# Patient Record
Sex: Female | Born: 1980 | Race: White | Hispanic: No | Smoking: Never smoker
Health system: Southern US, Community
[De-identification: ages and names within clinical notes are randomized; demographics above are authoritative.]

## PROBLEM LIST (undated history)

## (undated) HISTORY — PX: CLEFT PALATE REPAIR: SUR1165

---

## 2012-08-03 ENCOUNTER — Emergency Department: Payer: Self-pay | Admitting: Emergency Medicine

## 2012-08-03 LAB — URINALYSIS, COMPLETE
Bilirubin,UR: NEGATIVE
Glucose,UR: NEGATIVE mg/dL (ref 0–75)
Ketone: NEGATIVE
Nitrite: NEGATIVE
Ph: 5 (ref 4.5–8.0)
Protein: NEGATIVE
RBC,UR: 11 /HPF (ref 0–5)
Specific Gravity: 1.028 (ref 1.003–1.030)
Squamous Epithelial: 9

## 2012-08-03 LAB — PREGNANCY, URINE: Pregnancy Test, Urine: NEGATIVE m[IU]/mL

## 2012-08-15 ENCOUNTER — Emergency Department: Payer: Self-pay | Admitting: Emergency Medicine

## 2012-08-15 LAB — CBC WITH DIFFERENTIAL/PLATELET
Basophil #: 0.1 10*3/uL (ref 0.0–0.1)
HCT: 37.8 % (ref 35.0–47.0)
Lymphocyte #: 1.9 10*3/uL (ref 1.0–3.6)
MCH: 27.1 pg (ref 26.0–34.0)
Platelet: 412 10*3/uL (ref 150–440)
WBC: 8 10*3/uL (ref 3.6–11.0)

## 2012-08-15 LAB — COMPREHENSIVE METABOLIC PANEL
Albumin: 4.2 g/dL (ref 3.4–5.0)
Alkaline Phosphatase: 89 U/L (ref 50–136)
Bilirubin,Total: 0.4 mg/dL (ref 0.2–1.0)
Calcium, Total: 9.3 mg/dL (ref 8.5–10.1)
Chloride: 105 mmol/L (ref 98–107)
Co2: 28 mmol/L (ref 21–32)
Creatinine: 0.7 mg/dL (ref 0.60–1.30)
EGFR (Non-African Amer.): >60
Glucose: 73 mg/dL (ref 65–99)
Osmolality: 274 (ref 275–301)
Potassium: 3.5 mmol/L (ref 3.5–5.1)
SGOT(AST): 20 U/L (ref 15–37)
SGPT (ALT): 31 U/L (ref 12–78)
Total Protein: 8.3 g/dL — ABNORMAL HIGH (ref 6.4–8.2)

## 2012-08-15 LAB — URINALYSIS, COMPLETE
Bilirubin,UR: NEGATIVE
Blood: NEGATIVE
Ketone: NEGATIVE
Nitrite: NEGATIVE
Protein: NEGATIVE
RBC,UR: 2 /HPF (ref 0–5)
Squamous Epithelial: 3
WBC UR: 3 /HPF (ref 0–5)

## 2012-08-15 LAB — PREGNANCY, URINE: Pregnancy Test, Urine: NEGATIVE m[IU]/mL

## 2012-08-15 LAB — LIPASE, BLOOD: Lipase: 130 U/L (ref 73–393)

## 2015-09-09 ENCOUNTER — Encounter (HOSPITAL_COMMUNITY): Payer: Self-pay | Admitting: *Deleted

## 2015-09-09 ENCOUNTER — Emergency Department (HOSPITAL_COMMUNITY): Payer: No Typology Code available for payment source

## 2015-09-09 ENCOUNTER — Emergency Department (HOSPITAL_COMMUNITY)
Admission: EM | Admit: 2015-09-09 | Discharge: 2015-09-10 | Disposition: A | Discharge status: Home / Self Care | Payer: No Typology Code available for payment source | Attending: Emergency Medicine | Admitting: Emergency Medicine

## 2015-09-09 DIAGNOSIS — Z91040 Latex allergy status: Secondary | ICD-10-CM | POA: Insufficient documentation

## 2015-09-09 DIAGNOSIS — R079 Chest pain, unspecified: Secondary | ICD-10-CM | POA: Diagnosis present

## 2015-09-09 DIAGNOSIS — E669 Obesity, unspecified: Secondary | ICD-10-CM | POA: Diagnosis not present

## 2015-09-09 DIAGNOSIS — Z880 Allergy status to penicillin: Secondary | ICD-10-CM | POA: Insufficient documentation

## 2015-09-09 LAB — CBC
HCT: 36.7 % (ref 36.0–46.0)
Hemoglobin: 11.3 g/dL — ABNORMAL LOW (ref 12.0–15.0)
MCH: 25.7 pg — ABNORMAL LOW (ref 26.0–34.0)
MCHC: 30.8 g/dL (ref 30.0–36.0)
MCV: 83.4 fL (ref 78.0–100.0)
Platelets: 419 10*3/uL — ABNORMAL HIGH (ref 150–400)
RBC: 4.4 MIL/uL (ref 3.87–5.11)
RDW: 14.4 % (ref 11.5–15.5)
WBC: 10.4 10*3/uL (ref 4.0–10.5)

## 2015-09-09 LAB — BASIC METABOLIC PANEL
Anion gap: 9 (ref 5–15)
BUN: 9 mg/dL (ref 6–20)
CALCIUM: 8.5 mg/dL — AB (ref 8.9–10.3)
CO2: 23 mmol/L (ref 22–32)
Chloride: 109 mmol/L (ref 101–111)
Creatinine, Ser: 0.63 mg/dL (ref 0.44–1.00)
GFR calc Af Amer: >60 mL/min (ref >60)
GLUCOSE: 109 mg/dL — AB (ref 65–99)
Potassium: 3.8 mmol/L (ref 3.5–5.1)
Sodium: 141 mmol/L (ref 135–145)

## 2015-09-09 NOTE — ED Notes (Signed)
Pt states that she woke up around 21:30 tonight with substernal chest pain; pt states that this has happened before; pt denies any other symptoms; pt states that it feels like someone is jumping up and down on her chest

## 2015-09-10 LAB — I-STAT TROPONIN, ED
TROPONIN I, POC: 0.01 ng/mL (ref 0.00–0.08)
Troponin i, poc: 0 ng/mL (ref 0.00–0.08)

## 2015-09-10 MED ORDER — PANTOPRAZOLE SODIUM 20 MG PO TBEC: 20.0000 mg | DELAYED_RELEASE_TABLET | Freq: Every day | ORAL | Status: DC

## 2015-09-10 MED ORDER — SUCRALFATE 1 G PO TABS
1.0000 g | ORAL_TABLET | Freq: Once | ORAL | Status: AC
  Administered 2015-09-10: 1 g via ORAL
  Filled 2015-09-10: qty 1

## 2015-09-10 MED ORDER — PANTOPRAZOLE SODIUM 40 MG PO TBEC
40.0000 mg | DELAYED_RELEASE_TABLET | Freq: Once | ORAL | Status: AC
  Administered 2015-09-10: 40 mg via ORAL
  Filled 2015-09-10: qty 1

## 2015-09-10 NOTE — Discharge Instructions (Signed)
Nonspecific Chest Pain  °Chest pain can be caused by many different conditions. There is always a chance that your pain could be related to something serious, such as a heart attack or a blood clot in your lungs. Chest pain can also be caused by conditions that are not life-threatening. If you have chest pain, it is very important to follow up with your health care provider. °CAUSES  °Chest pain can be caused by: °· Heartburn. °· Pneumonia or bronchitis. °· Anxiety or stress. °· Inflammation around your heart (pericarditis) or lung (pleuritis or pleurisy). °· A blood clot in your lung. °· A collapsed lung (pneumothorax). It can develop suddenly on its own (spontaneous pneumothorax) or from trauma to the chest. °· Shingles infection (varicella-zoster virus). °· Heart attack. °· Damage to the bones, muscles, and cartilage that make up your chest wall. This can include: °¨ Bruised bones due to injury. °¨ Strained muscles or cartilage due to frequent or repeated coughing or overwork. °¨ Fracture to one or more ribs. °¨ Sore cartilage due to inflammation (costochondritis). °RISK FACTORS  °Risk factors for chest pain may include: °· Activities that increase your risk for trauma or injury to your chest. °· Respiratory infections or conditions that cause frequent coughing. °· Medical conditions or overeating that can cause heartburn. °· Heart disease or family history of heart disease. °· Conditions or health behaviors that increase your risk of developing a blood clot. °· Having had chicken pox (varicella zoster). °SIGNS AND SYMPTOMS °Chest pain can feel like: °· Burning or tingling on the surface of your chest or deep in your chest. °· Crushing, pressure, aching, or squeezing pain. °· Dull or sharp pain that is worse when you move, cough, or take a deep breath. °· Pain that is also felt in your back, neck, shoulder, or arm, or pain that spreads to any of these areas. °Your chest pain may come and go, or it may stay  constant. °DIAGNOSIS °Lab tests or other studies may be needed to find the cause of your pain. Your health care provider may have you take a test called an ambulatory ECG (electrocardiogram). An ECG records your heartbeat patterns at the time the test is performed. You may also have other tests, such as: °· Transthoracic echocardiogram (TTE). During echocardiography, sound waves are used to create a picture of all of the heart structures and to look at how blood flows through your heart. °· Transesophageal echocardiogram (TEE). This is a more advanced imaging test that obtains images from inside your body. It allows your health care provider to see your heart in finer detail. °· Cardiac monitoring. This allows your health care provider to monitor your heart rate and rhythm in real time. °· Holter monitor. This is a portable device that records your heartbeat and can help to diagnose abnormal heartbeats. It allows your health care provider to track your heart activity for several days, if needed. °· Stress tests. These can be done through exercise or by taking medicine that makes your heart beat more quickly. °· Blood tests. °· Imaging tests. °TREATMENT  °Your treatment depends on what is causing your chest pain. Treatment may include: °· Medicines. These may include: °¨ Acid blockers for heartburn. °¨ Anti-inflammatory medicine. °¨ Pain medicine for inflammatory conditions. °¨ Antibiotic medicine, if an infection is present. °¨ Medicines to dissolve blood clots. °¨ Medicines to treat coronary artery disease. °· Supportive care for conditions that do not require medicines. This may include: °¨ Resting. °¨ Applying heat   or cold packs to injured areas. °¨ Limiting activities until pain decreases. °HOME CARE INSTRUCTIONS °· If you were prescribed an antibiotic medicine, finish it all even if you start to feel better. °· Avoid any activities that bring on chest pain. °· Do not use any tobacco products, including  cigarettes, chewing tobacco, or electronic cigarettes. If you need help quitting, ask your health care provider. °· Do not drink alcohol. °· Take medicines only as directed by your health care provider. °· Keep all follow-up visits as directed by your health care provider. This is important. This includes any further testing if your chest pain does not go away. °· If heartburn is the cause for your chest pain, you may be told to keep your head raised (elevated) while sleeping. This reduces the chance that acid will go from your stomach into your esophagus. °· Make lifestyle changes as directed by your health care provider. These may include: °¨ Getting regular exercise. Ask your health care provider to suggest some activities that are safe for you. °¨ Eating a heart-healthy diet. A registered dietitian can help you to learn healthy eating options. °¨ Maintaining a healthy weight. °¨ Managing diabetes, if necessary. °¨ Reducing stress. °SEEK MEDICAL CARE IF: °· Your chest pain does not go away after treatment. °· You have a rash with blisters on your chest. °· You have a fever. °SEEK IMMEDIATE MEDICAL CARE IF:  °· Your chest pain is worse. °· You have an increasing cough, or you cough up blood. °· You have severe abdominal pain. °· You have severe weakness. °· You faint. °· You have chills. °· You have sudden, unexplained chest discomfort. °· You have sudden, unexplained discomfort in your arms, back, neck, or jaw. °· You have shortness of breath at any time. °· You suddenly start to sweat, or your skin gets clammy. °· You feel nauseous or you vomit. °· You suddenly feel light-headed or dizzy. °· Your heart begins to beat quickly, or it feels like it is skipping beats. °These symptoms may represent a serious problem that is an emergency. Do not wait to see if the symptoms will go away. Get medical help right away. Call your local emergency services (911 in the [place] Do not drive yourself to the hospital. °  °This  information is not intended to replace advice given to you by your health care provider. Make sure you discuss any questions you have with your health care provider. °  °Document Released: 03/31/2005 Document Revised: 07/12/2014 Document Reviewed: 01/25/2014 °Elsevier Interactive Patient Education ©2016 [employer] ° °

## 2015-09-10 NOTE — ED Notes (Signed)
PA at bedside.

## 2015-09-10 NOTE — ED Provider Notes (Signed)
CSN: [id number]     Arrival date & time 09/09/15  2216 History   First MD Initiated Contact with Patient 09/10/15 [phone]     Chief Complaint  Patient presents with  . Chest Pain     (Consider location/radiation/quality/duration/timing/severity/associated sxs/prior Treatment) HPI Comments: 35 year old female with no significant past medical history presents to the emergency department for evaluation of chest pain. Patient has felt pain from her upper chest to her lower chest. She denies any radiation to her back or shoulders. Pain has been constant since onset. It began shortly after waking from sleep at 2130. Patient had a similar episode of symptoms 1 week ago and was evaluated at [facility] with a negative cardiac workup. Patient denies taking any medications for her symptoms today. She describes the pain as "someone jumping up and down a lot on my chest". Patient denies syncope, diaphoresis, nausea, vomiting, or extremity numbness/weakness. She has had no leg swelling and denies a personal history of DVT/PE. She is not currently on birth control and denies recent surgeries or hospitalization. No prolonged travel. Patient states that she eats regular meals. She does not report a history of reflux. She has been sleeping less and more erratically lately as she works 3 jobs. FHx of ACS in grandparents in their 62's. No FHx of sudden cardiac death.  Patient is a 35 y.o. female presenting with chest pain. The history is provided by the patient. No language interpreter was used.  Chest Pain Associated symptoms: no fever, no nausea and not vomiting     History reviewed. No pertinent past medical history. Past Surgical History  Procedure Laterality Date  . Cleft palate repair     No family history on file. Social History  Substance Use Topics  . Smoking status: Never Smoker   . Smokeless tobacco: None  . Alcohol Use: No   OB History    No data available      Review of Systems  Constitutional:  Negative for fever.  Cardiovascular: Positive for chest pain.  Gastrointestinal: Negative for nausea and vomiting.  Neurological: Negative for syncope.  All other systems reviewed and are negative.   Allergies  Benadryl; Latex; and Penicillins  Home Medications   Prior to Admission medications   Medication Sig Start Date End Date Taking? Authorizing Provider  pantoprazole (PROTONIX) 20 MG tablet Take 1 tablet (20 mg total) by mouth daily. 09/10/15   Antony Madura, PA-C   BP 123/63 mmHg  Pulse 77  Temp(Src) 98.1 F (36.7 C) (Oral)  Resp 21  Ht  (1.651 m)  Wt 117.935 kg  BMI 43.27 kg/m2  SpO2 100%  LMP 09/08/2015   Physical Exam  Constitutional: She is oriented to person, place, and time. She appears well-developed and well-nourished. No distress.  Obese female in NAD  HENT:  Head: Normocephalic and atraumatic.  Eyes: Conjunctivae and EOM are normal. No scleral icterus.  Neck: Normal range of motion.  No JVD  Cardiovascular: Normal rate, regular rhythm and intact distal pulses.   Pulmonary/Chest: Effort normal. No respiratory distress. She has no wheezes. She has no rales.  Respirations even and unlabored  Musculoskeletal: Normal range of motion.  Neurological: She is alert and oriented to person, place, and time. She exhibits normal muscle tone. Coordination normal.  Skin: Skin is warm and dry. No rash noted. She is not diaphoretic. No erythema. No pallor.  Psychiatric: She has a normal mood and affect. Her behavior is normal.  Nursing note and vitals reviewed.  ED Course  Procedures (including critical care time) Labs Review Labs Reviewed  BASIC METABOLIC PANEL - Abnormal; Notable for the following:    Glucose, Bld 109 (*)    Calcium 8.5 (*)    All other components within normal limits  CBC - Abnormal; Notable for the following:    Hemoglobin 11.3 (*)    MCH 25.7 (*)    Platelets 419 (*)    All other components within normal limits  I-STAT TROPOININ, ED   Rosezena SensorI-STAT TROPOININ, ED    Imaging Review Dg Chest 2 View  09/09/2015  CLINICAL DATA:  Chest pain EXAM: CHEST  2 VIEW COMPARISON:  None. FINDINGS: The heart size and mediastinal contours are within normal limits. Both lungs are clear. The visualized skeletal structures are unremarkable. IMPRESSION: No active cardiopulmonary disease. Electronically Signed   By: Marlan Palauharles  Clark M.D.   On: 09/09/2015 22:55   I have personally reviewed and evaluated these images and lab results as part of my medical decision-making.   EKG Interpretation   Date/Time:  Tuesday September 09 2015 22:34:17 EST Ventricular Rate:  86 PR Interval:  156 QRS Duration: 92 QT Interval:  364 QTC Calculation: 435 R Axis:   73 Text Interpretation:  Sinus rhythm Normal ECG Confirmed by POLLINA  MD,  CHRISTOPHER [phone]([id number]) on 09/10/2015 4:53:52 AM      MDM   Final diagnoses:  Chest pain, unspecified chest pain type    35 year old female presents to the emergency department for evaluation of constant chest pain which began at 2130. She has a negative troponin 2 and a nonischemic EKG. Chest x-ray negative for acute cardiopulmonary findings. Doubt aortic dissection. Also doubt pulmonary embolus. Patient is PERC negative. Heart score is 1 for obesity c/w low risk of MACE. Will d/c with cardiology referral for f/u and outpatient stress test. Will start on Protonix for coverage of reflux. Return precautions discussed and provided. Patient discharged in satisfactory condition with no unaddressed concerns.   Filed Vitals:   09/09/15 2229 09/10/15 0354  BP: 145/72 123/63  Pulse: 87 77  Temp: 98.1 F (36.7 C)   TempSrc: Oral   Resp: 20 21  Height: 5\' 5"  (1.651 m)   Weight: 117.935 kg   SpO2:  100%     Antony MaduraKelly Brode Sculley, PA-C 09/10/15 0459  Gilda Creasehristopher J Pollina, MD 09/10/15 [phone]

## 2015-11-23 ENCOUNTER — Encounter (HOSPITAL_COMMUNITY): Payer: Self-pay | Admitting: Emergency Medicine

## 2015-11-23 ENCOUNTER — Ambulatory Visit (HOSPITAL_COMMUNITY)
Admission: EM | Admit: 2015-11-23 | Discharge: 2015-11-23 | Disposition: A | Discharge status: Home / Self Care | Payer: PRIVATE HEALTH INSURANCE | Attending: Family Medicine | Admitting: Family Medicine

## 2015-11-23 DIAGNOSIS — J069 Acute upper respiratory infection, unspecified: Secondary | ICD-10-CM | POA: Diagnosis not present

## 2015-11-23 DIAGNOSIS — J309 Allergic rhinitis, unspecified: Secondary | ICD-10-CM | POA: Diagnosis not present

## 2015-11-23 MED ORDER — HYDROCOD POLST-CPM POLST ER 10-8 MG/5ML PO SUER: 5.0000 mL | Freq: Two times a day (BID) | ORAL | Status: DC | PRN

## 2015-11-23 NOTE — ED Provider Notes (Signed)
CSN: [id number]     Arrival date & time 11/23/15  1756 History   First MD Initiated Contact with Patient 11/23/15 1912     No chief complaint on file.  (Consider location/radiation/quality/duration/timing/severity/associated sxs/prior Treatment) Patient is a 35 y.o. female presenting with cough. The history is provided by the patient. No language interpreter was used.  Cough Cough characteristics:  Non-productive (Cough for 3 days) Severity:  Moderate Onset quality:  Gradual Timing:  Constant Progression:  Unchanged Chronicity:  Recurrent Smoker: no   Context: animal exposure   Context: not exposure to allergens, not sick contacts, not smoke exposure, not weather changes and not with activity   Worsened by:  Environmental changes (Worse with heat. She keeps her fan on at home) Ineffective treatments:  None tried Associated symptoms: chest pain and sinus congestion   Associated symptoms: no chills, no ear pain, no fever, no headaches, no rhinorrhea, no shortness of breath and no sore throat   Associated symptoms comment:  Chest pain with cough   No past medical history on file. Past Surgical History  Procedure Laterality Date  . Cleft palate repair     No family history on file. Social History  Substance Use Topics  . Smoking status: Never Smoker   . Smokeless tobacco: Not on file  . Alcohol Use: No   OB History    No data available     Review of Systems  Constitutional: Negative for fever and chills.  HENT: Negative for ear pain, rhinorrhea, sinus pressure, sneezing and sore throat.   Respiratory: Positive for cough. Negative for shortness of breath.   Cardiovascular: Positive for chest pain.  Gastrointestinal: Negative.   Neurological: Negative for headaches.  All other systems reviewed and are negative.   Allergies  Benadryl; Latex; and Penicillins  Home Medications   Prior to Admission medications   Medication Sig Start Date End Date Taking? Authorizing  Provider  pantoprazole (PROTONIX) 20 MG tablet Take 1 tablet (20 mg total) by mouth daily. 09/10/15   Antony Madura, PA-C   Meds Ordered and Administered this Visit  Medications - No data to display  Pulse 93  Temp(Src) 98.8 F (37.1 C) (Oral)  Resp 20  SpO2 100% No data found.   Physical Exam  Constitutional: She appears well-developed. No distress.  HENT:  Head: Normocephalic.  Right Ear: Tympanic membrane, external ear and ear canal normal. No drainage. Tympanic membrane is not erythematous and not bulging.  Left Ear: Tympanic membrane, external ear and ear canal normal. No drainage. Tympanic membrane is not erythematous and not bulging.  Nose: Right sinus exhibits no maxillary sinus tenderness and no frontal sinus tenderness. Left sinus exhibits no maxillary sinus tenderness and no frontal sinus tenderness.  Mouth/Throat: Uvula is midline, oropharynx is clear and moist and mucous membranes are normal.    Cardiovascular: Normal rate, regular rhythm and normal heart sounds.   No murmur heard. Pulmonary/Chest: Effort normal and breath sounds normal. No respiratory distress. She has no wheezes. She exhibits no tenderness.  Nursing note and vitals reviewed.   ED Course  Procedures (including critical care time)  Labs Review Labs Reviewed - No data to display  Imaging Review No results found.   Visual Acuity Review  Right Eye Distance:   Left Eye Distance:   Bilateral Distance:    Right Eye Near:   Left Eye Near:    Bilateral Near:         MDM  No diagnosis found. Upper respiratory infection  Allergic rhinitis, unspecified allergic rhinitis type  Likely viral in origin. Patient looks completely stable. Pulmonary exam benign'O2 Sat 100% on RA. Tussionex prescribed prn cough and nasal congestion. No A/B indicated at this time. Patient reassured. F/U as needed.    Doreene ElandKehinde T Hurman Ketelsen, MD 11/23/15 [phone]

## 2015-11-23 NOTE — ED Notes (Signed)
PT reports a cough that started Friday. Nonproductive. Runny nose. PT reports she is beginning to have facial pressure. PT reports she gets bronchitis and sinus infections a few times per year.

## 2015-11-23 NOTE — Discharge Instructions (Signed)

## 2015-11-30 ENCOUNTER — Ambulatory Visit (HOSPITAL_COMMUNITY)
Admission: EM | Admit: 2015-11-30 | Discharge: 2015-11-30 | Disposition: A | Discharge status: Home / Self Care | Payer: PRIVATE HEALTH INSURANCE | Attending: Emergency Medicine | Admitting: Emergency Medicine

## 2015-11-30 ENCOUNTER — Encounter (HOSPITAL_COMMUNITY): Payer: Self-pay | Admitting: *Deleted

## 2015-11-30 DIAGNOSIS — J018 Other acute sinusitis: Secondary | ICD-10-CM

## 2015-11-30 MED ORDER — AZITHROMYCIN 250 MG PO TABS: ORAL_TABLET | ORAL | Status: AC

## 2015-11-30 MED ORDER — PREDNISONE 50 MG PO TABS
ORAL_TABLET | ORAL | Status: AC
Start: 2015-11-30 — End: ?

## 2015-11-30 MED ORDER — HYDROCOD POLST-CPM POLST ER 10-8 MG/5ML PO SUER: 5.0000 mL | Freq: Two times a day (BID) | ORAL | Status: AC | PRN

## 2015-11-30 NOTE — Discharge Instructions (Signed)
You have a sinus infection. Take azithromycin and prednisone as prescribed. Use the Tussionex as needed for cough. While you're at work you can mix honey, water, and lemon juice. This is a natural cough suppressant. You should start to feel better in the next 2 days. Follow-up as needed.

## 2015-11-30 NOTE — ED Notes (Signed)
[facility] - given Rx for Tussionex w/ Codeine.  Pt has been taking, but is getting worse.  C/O thick, greenish brown nasal discharge, facial pain, with continued cough.  Unsure if fevers.

## 2015-11-30 NOTE — ED Provider Notes (Signed)
CSN: [id number]     Arrival date & time 11/30/15  1255 History   First MD Initiated Contact with Patient 11/30/15 1306     Chief Complaint  Patient presents with  . Facial Pain  . Nasal Congestion  . Cough   (Consider location/radiation/quality/duration/timing/severity/associated sxs/prior Treatment) HPI  She is a 35 year old woman here for evaluation of cough and sinus pressure. She states her symptoms started 10 days ago. She was seen here one week ago and diagnosed with likely viral upper respiratory infection. She was given a prescription for Tussionex. She states the Tussionex has helped with the cough. However, she reports worsening sinus pressure and congestion. She is getting blood tinged green sputum out of her nose. She also reports copious postnasal drainage. She has intermittent right ear pain. No known fevers. No nausea or vomiting. The cough is better overall, but still quite bothersome and she works.  History reviewed. No pertinent past medical history. Past Surgical History  Procedure Laterality Date  . Cleft palate repair     No family history on file. Social History  Substance Use Topics  . Smoking status: Never Smoker   . Smokeless tobacco: None  . Alcohol Use: Yes     Comment: occasional    OB History    No data available     Review of Systems As in history of present illness Allergies  Penicillins; Benadryl; and Latex  Home Medications   Prior to Admission medications   Medication Sig Start Date End Date Taking? Authorizing Provider  MedroxyPROGESTERone Acetate (DEPO-PROVERA IM) Inject into the muscle.   Yes Historical Provider, MD  azithromycin (ZITHROMAX Z-PAK) 250 MG tablet Take 2 pills today, then 1 pill daily until gone. 11/30/15   Charm RingsErin J Donyel Castagnola, MD  chlorpheniramine-HYDROcodone (TUSSIONEX PENNKINETIC ER) 10-8 MG/5ML SUER Take 5 mLs by mouth every 12 (twelve) hours as needed for cough. 11/30/15   Charm RingsErin J Jaylanie Boschee, MD  predniSONE (DELTASONE) 50 MG tablet  Take 1 pill daily for 5 days. 11/30/15   Charm RingsErin J Amarii Bordas, MD   Meds Ordered and Administered this Visit  Medications - No data to display  BP 149/89 mmHg  Pulse 101  Temp(Src) 98.3 F (36.8 C) (Oral)  Resp 18  SpO2 99%  LMP  (LMP Unknown) No data found.   Physical Exam  Constitutional: She is oriented to person, place, and time. She appears well-developed and well-nourished. No distress.  HENT:  Mouth/Throat: No oropharyngeal exudate.  Copious thick postnasal drainage. Mild oropharyngeal erythema. Nasal discharge present with minimal erythema. TMs normal bilaterally.  Neck: Neck supple.  Cardiovascular: Normal rate, regular rhythm and normal heart sounds.   No murmur heard. Pulmonary/Chest: Effort normal and breath sounds normal. No respiratory distress. She has no wheezes. She has no rales.  Lymphadenopathy:    She has no cervical adenopathy.  Neurological: She is alert and oriented to person, place, and time.    ED Course  Procedures (including critical care time)  Labs Review Labs Reviewed - No data to display  Imaging Review No results found.   MDM   1. Other acute sinusitis    We'll treat with a Z-Pak and prednisone. I did provide a refill of the Tussionex. Follow-up as needed.    Charm RingsErin J Maribeth Jiles, MD 11/30/15 [phone]

## 2015-12-21 ENCOUNTER — Encounter (HOSPITAL_COMMUNITY): Payer: Self-pay

## 2015-12-21 DIAGNOSIS — N61 Mastitis without abscess: Secondary | ICD-10-CM | POA: Diagnosis not present

## 2015-12-21 DIAGNOSIS — Z91040 Latex allergy status: Secondary | ICD-10-CM | POA: Insufficient documentation

## 2015-12-21 DIAGNOSIS — L03311 Cellulitis of abdominal wall: Secondary | ICD-10-CM | POA: Insufficient documentation

## 2015-12-21 NOTE — ED Notes (Signed)
Pt complaining of redness and swelling to R side. Pt states had zit Thursday, woresning today. Pt with large red area to R thoracic. Pt denies any drainage or puss. 9/10 pain.

## 2015-12-22 ENCOUNTER — Emergency Department (HOSPITAL_COMMUNITY)
Admission: EM | Admit: 2015-12-22 | Discharge: 2015-12-22 | Disposition: A | Discharge status: Home / Self Care | Payer: No Typology Code available for payment source | Attending: Emergency Medicine | Admitting: Emergency Medicine

## 2015-12-22 DIAGNOSIS — L03311 Cellulitis of abdominal wall: Secondary | ICD-10-CM

## 2015-12-22 DIAGNOSIS — N61 Mastitis without abscess: Secondary | ICD-10-CM

## 2015-12-22 MED ORDER — SULFAMETHOXAZOLE-TRIMETHOPRIM 800-160 MG PO TABS
1.0000 | ORAL_TABLET | Freq: Once | ORAL | Status: AC
  Administered 2015-12-22: 1 via ORAL
  Filled 2015-12-22: qty 1

## 2015-12-22 MED ORDER — SULFAMETHOXAZOLE-TRIMETHOPRIM 800-160 MG PO TABS: 1.0000 | ORAL_TABLET | Freq: Two times a day (BID) | ORAL | Status: DC

## 2015-12-22 NOTE — ED Provider Notes (Signed)
CSN: [id number]     Arrival date & time 12/21/15  2253 History  By signing my name below, I, [facility]tr[facility]arrissa Washington, attest that this documentation has been prepared under the direction and in the presence of Derwood KaplanAnkit Ildefonso Keaney, MD. Electronically Signed: Randell PatientMarrissa Washington, ED Scribe. 12/22/2015. 2:21 AM.   Chief Complaint  Patient presents with  . Abscess    The history is provided by the patient. No language interpreter was used.   HPI Comments: Meghan Schroeder is a 35 y.o. female who presents to the Emergency Department complaining of constant, gradually increasing in redness and swelling, mildly painful abscess to her right upper abdomen that first appeared 4 days ago. Pt states that she had a pimple appear on her right side  that completely resolved 3 days ago followed by diffuse erythema to the affected area that extends to the bottom of her right breast for the past 2 days Denies recent antibiotic usage. Denies any other symptoms currently.   History reviewed. No pertinent past medical history. Past Surgical History  Procedure Laterality Date  . Cleft palate repair     History reviewed. No pertinent family history. Social History  Substance Use Topics  . Smoking status: Never Smoker   . Smokeless tobacco: None  . Alcohol Use: Yes     Comment: occasional    OB History    No data available     Review of Systems A complete 10 system review of systems was obtained and all systems are negative except as noted in the HPI and PMH.    Allergies  Penicillins; Benadryl; and Latex  Home Medications   Prior to Admission medications   Medication Sig Start Date End Date Taking? Authorizing Provider  medroxyPROGESTERone (DEPO-PROVERA) 150 MG/ML injection Inject 1 mL into the muscle every 3 (three) months.  10/13/15 10/12/16 Yes Historical Provider, MD  azithromycin (ZITHROMAX Z-PAK) 250 MG tablet Take 2 pills today, then 1 pill daily until gone. Patient not taking: Reported on 12/22/2015  11/30/15   Charm RingsErin J Honig, MD  chlorpheniramine-HYDROcodone [facility](TUSSIONEX PENNKINETIC ER) 10-8 MG/5ML SUER Take 5 mLs by mouth every 12 (twelve) hours as needed for cough. Patient not taking: Reported on 12/22/2015 11/30/15   Charm RingsErin J Honig, MD  predniSONE (DELTASONE) 50 MG tablet Take 1 pill daily for 5 days. Patient not taking: Reported on 12/22/2015 11/30/15   Charm RingsErin J Honig, MD  sulfamethoxazole-trimethoprim (BACTRIM DS,SEPTRA DS) 800-160 MG tablet Take 1 tablet by mouth 2 (two) times daily. 12/22/15 12/29/15  Mayda Shippee, MD   BP 131/66 mmHg  Pulse 97  Temp(Src) 98.5 F (36.9 C) (Oral)  Resp 18  SpO2 100%  LMP  (LMP Unknown) Physical Exam  Constitutional: She is oriented to person, place, and time. She appears well-developed and well-nourished. No distress.  HENT:  Head: Normocephalic and atraumatic.  Eyes: Conjunctivae are normal.  Neck: Normal range of motion.  Cardiovascular: Normal rate.   Pulmonary/Chest: Effort normal. No respiratory distress.  Musculoskeletal: Normal range of motion.  Neurological: She is alert and oriented to person, place, and time.  Skin: Skin is warm and dry. Lesion noted. There is erythema.  On the right upper abdomen there is a 6 x12 cm erythematous lesion with induration in the middle and TTP. Pt also has significant erythema on her breast tissue with induration about 8 cm x 3 cm induration at the base.  Psychiatric: She has a normal mood and affect. Her behavior is normal.  Nursing note and vitals reviewed.  ED Course  Procedures   EMERGENCY DEPARTMENT US SOFT TISSUE INTERPRETATION "Study: Limited Soft Tissue Ultrasound"  INDICATIONS: Soft tissue infection Multiple views of the body part were obtained in real-time with a multi-frequency linear probe PERFORMED BY:  Myself IMAGES ARCHIVED?: Yes SIDE:Right  BODY PART:Chest Wall and Abdominal wall FINDINGS: No abcess noted INTERPRETATION:  Cellulitis present     DIAGNOSTIC STUDIES: Oxygen  Saturation is 99% on RA, normal by my interpretation.    COORDINATION OF CARE: 1:47 AM Will [place] abscess. Will prescribe antibiotics. Discussed treatment plan with pt at bedside and pt agreed to plan.  2:21 AM  US shows no abscess. Will prescribe antibiotics.   MDM   Final diagnoses:  Cellulitis of abdominal wall  Cellulitis of breast    I personally performed the services described in this documentation, which was scribed in my presence. The recorded information has been reviewed and is accurate.  Pt comes in with cc of rash. She has large erythematous area over her breast and RUQ - likely due to bacterial invasion from skin invasion due to her bra. She is immunocompetent and has no SIRS. US soft tissue reveals no abscess - so we will start bactrim and advise f/u with her PCP in 3 days. Strict ER return precautions have been discussed, and patient is agreeing with the plan and is comfortable with the workup done and the recommendations from the ER.    Derwood Kaplan, MD 12/22/15 [phone]

## 2015-12-22 NOTE — ED Notes (Signed)
Pt departed in NAD.  

## 2015-12-22 NOTE — Discharge Instructions (Signed)

## 2015-12-23 ENCOUNTER — Ambulatory Visit (HOSPITAL_COMMUNITY)
Admission: EM | Admit: 2015-12-23 | Discharge: 2015-12-23 | Disposition: A | Discharge status: Home / Self Care | Payer: PRIVATE HEALTH INSURANCE | Attending: Emergency Medicine | Admitting: Emergency Medicine

## 2015-12-23 ENCOUNTER — Encounter (HOSPITAL_COMMUNITY): Payer: Self-pay | Admitting: Emergency Medicine

## 2015-12-23 DIAGNOSIS — L03311 Cellulitis of abdominal wall: Secondary | ICD-10-CM | POA: Diagnosis not present

## 2015-12-23 DIAGNOSIS — N61 Mastitis without abscess: Secondary | ICD-10-CM

## 2015-12-23 MED ORDER — CLINDAMYCIN HCL 300 MG PO CAPS: 300.0000 mg | ORAL_CAPSULE | Freq: Four times a day (QID) | ORAL | Status: DC

## 2015-12-23 MED ORDER — NAPROXEN 500 MG PO TABS: 500.0000 mg | ORAL_TABLET | Freq: Two times a day (BID) | ORAL | Status: AC

## 2015-12-23 MED ORDER — HYDROCODONE-ACETAMINOPHEN 5-325 MG PO TABS: 1.0000 | ORAL_TABLET | Freq: Four times a day (QID) | ORAL | Status: AC | PRN

## 2015-12-23 NOTE — Discharge Instructions (Signed)
Please stop taking the Bactrim antibiotic as you are being prescribed a new antibiotic with more coverage.  Please be sure to eat a small snack or take probiotics (or eat yogurt with probiotics) to help prevent stomach upset.    Norco/Vicodin (hydrocodone-acetaminophen) is a narcotic pain medication, do not combine these medications with others containing tylenol. While taking, do not drink alcohol, drive, or perform any other activities that requires focus while taking these medications.   Please take antibiotics as prescribed and be sure to complete entire course even if you start to feel better to ensure infection does not come back.   Cellulitis Cellulitis is an infection of the skin and the tissue beneath it. The infected area is usually red and tender. Cellulitis occurs most often in the arms and lower legs.  CAUSES  Cellulitis is caused by bacteria that enter the skin through cracks or cuts in the skin. The most common types of bacteria that cause cellulitis are staphylococci and streptococci. SIGNS AND SYMPTOMS   Redness and warmth.  Swelling.  Tenderness or pain.  Fever. DIAGNOSIS  Your health care provider can usually determine what is wrong based on a physical exam. Blood tests may also be done. TREATMENT  Treatment usually involves taking an antibiotic medicine. HOME CARE INSTRUCTIONS   Take your antibiotic medicine as directed by your health care provider. Finish the antibiotic even if you start to feel better.  Keep the infected arm or leg elevated to reduce swelling.  Apply a warm cloth to the affected area up to 4 times per day to relieve pain.  Take medicines only as directed by your health care provider.  Keep all follow-up visits as directed by your health care provider. SEEK MEDICAL CARE IF:   You notice red streaks coming from the infected area.  Your red area gets larger or turns dark in color.  Your bone or joint underneath the infected area becomes  painful after the skin has healed.  Your infection returns in the same area or another area.  You notice a swollen bump in the infected area.  You develop new symptoms.  You have a fever. SEEK IMMEDIATE MEDICAL CARE IF:   You feel very sleepy.  You develop vomiting or diarrhea.  You have a general ill feeling (malaise) with muscle aches and pains.   This information is not intended to replace advice given to you by your health care provider. Make sure you discuss any questions you have with your health care provider.   Document Released: 03/31/2005 Document Revised: 03/12/2015 Document Reviewed: 09/06/2011 Elsevier Interactive Patient Education [employer]2016 [employer] Inc.

## 2015-12-23 NOTE — ED Notes (Signed)
[facility] a [facility] on her right side. The patient stated that [facility] and prescribed Bactrim DS. She stated that the pain and swelling is worse today.

## 2015-12-23 NOTE — ED Provider Notes (Signed)
CSN: [id number]     Arrival date & time 12/23/15  1302 History   First MD Initiated Contact with Patient 12/23/15 1338     Chief Complaint  Patient presents with  . Cellulitis   (Consider location/radiation/quality/duration/timing/severity/associated sxs/prior Treatment) HPI  Meghan Schroeder is a 35 y.o. female presenting to UC with c/o worsening Right sided abdominal and Right breast pain and swelling since dx of cellulitis on 12/21/15 in emergency department. She had an U/S of area that was negative for deep abscess. She was started on bactrim, and has been taking as prescribed but no relief. She has been taking tylenol and motrin with minimal relief. Pain is aching, sore, and burning, 8/10, worse with touch. Denies nipple discharge. Denies fever, chills, n/v/d. Reports allergy to PCN as a child and advised she could die if she took it so she has not had cephalosporins in the past.   History reviewed. No pertinent past medical history. Past Surgical History  Procedure Laterality Date  . Cleft palate repair     History reviewed. No pertinent family history. Social History  Substance Use Topics  . Smoking status: Never Smoker   . Smokeless tobacco: None  . Alcohol Use: Yes     Comment: occasional    OB History    No data available     Review of Systems  Constitutional: Negative for fever and chills.  Gastrointestinal: Positive for abdominal pain. Negative for nausea, vomiting, diarrhea and constipation.  Musculoskeletal: Negative for myalgias and back pain.  Skin: Positive for color change and rash. Negative for wound.  Neurological: Negative for syncope, weakness and headaches.    Allergies  Penicillins; Benadryl; and Latex  Home Medications   Prior to Admission medications   Medication Sig Start Date End Date Taking? Authorizing Provider  medroxyPROGESTERone (DEPO-PROVERA) 150 MG/ML injection Inject 1 mL into the muscle every 3 (three) months.  10/13/15 10/12/16 Yes  Historical Provider, MD  azithromycin (ZITHROMAX Z-PAK) 250 MG tablet Take 2 pills today, then 1 pill daily until gone. Patient not taking: Reported on 12/22/2015 11/30/15   Charm Rings, MD  chlorpheniramine-HYDROcodone [facility]) 10-8 MG/5ML SUER Take 5 mLs by mouth every 12 (twelve) hours as needed for cough. Patient not taking: Reported on 12/22/2015 11/30/15   Charm Rings, MD  clindamycin (CLEOCIN) 300 MG capsule Take 1 capsule (300 mg total) by mouth 4 (four) times daily. X 7 days 12/23/15   Junius Finner, PA-C  HYDROcodone-acetaminophen (NORCO/VICODIN) 5-325 MG tablet Take 1-2 tablets by mouth every 6 (six) hours as needed. 12/23/15   Junius Finner, PA-C  naproxen (NAPROSYN) 500 MG tablet Take 1 tablet (500 mg total) by mouth 2 (two) times daily. 12/23/15   Junius Finner, PA-C  predniSONE (DELTASONE) 50 MG tablet Take 1 pill daily for 5 days. Patient not taking: Reported on 12/22/2015 11/30/15   Charm Rings, MD   Meds Ordered and Administered this Visit  Medications - No data to display  BP 146/92 mmHg  Pulse 98  Temp(Src) 98.6 F (37 C) (Oral)  Resp 20  SpO2 99%  LMP  (LMP Unknown) No data found.   Physical Exam  Constitutional: She is oriented to person, place, and time. She appears well-developed and well-nourished.  HENT:  Head: Normocephalic and atraumatic.  Eyes: EOM are normal.  Neck: Normal range of motion.  Cardiovascular: Normal rate, regular rhythm and normal heart sounds.   Pulmonary/Chest: Effort normal. No respiratory distress. She has no wheezes. She has  no rales. She exhibits tenderness. Right breast exhibits no nipple discharge.    Right sided breast and Right flank erythema, induration and tenderness.    Abdominal: Soft. She exhibits no distension. There is tenderness.  Musculoskeletal: Normal range of motion.  Neurological: She is alert and oriented to person, place, and time.  Skin: Skin is warm and dry. Rash noted. There is erythema.    Psychiatric: She has a normal mood and affect. Her behavior is normal.  Nursing note and vitals reviewed.   ED Course  Procedures (including critical care time)  Labs Review Labs Reviewed - No data to display  Imaging Review No results found.    MDM   1. Cellulitis, abdominal wall   2. Cellulitis of right breast    Cellulitis of Right side of abdomen and chest wall/breast.   Consulted with Dr. Piedad ClimesHonig, due to pt's reported severe allergy to PCN, will avoid Keflex and change pt to Clindamycin instead of bactrim.  Encouraged warm compresses.  Rx: clindamycin 300mg  QID for 7 days, norco and ibuprofen.  Pt requested work not for light duty. Provided note for 3 days. Encouraged f/u with PCP or UC if needed, if symptoms not improving in 3-4 days, sooner if worsening. Patient verbalized understanding and agreement with treatment plan.     Junius Finnerrin O'Malley, PA-C 12/23/15 [phone]

## 2017-03-09 ENCOUNTER — Ambulatory Visit: Payer: BLUE CROSS/BLUE SHIELD | Admitting: Neurology

## 2017-03-10 ENCOUNTER — Encounter: Payer: Self-pay | Admitting: Neurology

## 2017-05-12 IMAGING — CR DG CHEST 2V
2 series · 2 of 2 positions shown · non-contrast
Comparison: None.

CLINICAL DATA: Chest pain

EXAM:
CHEST  2 VIEW

[w chest pa]
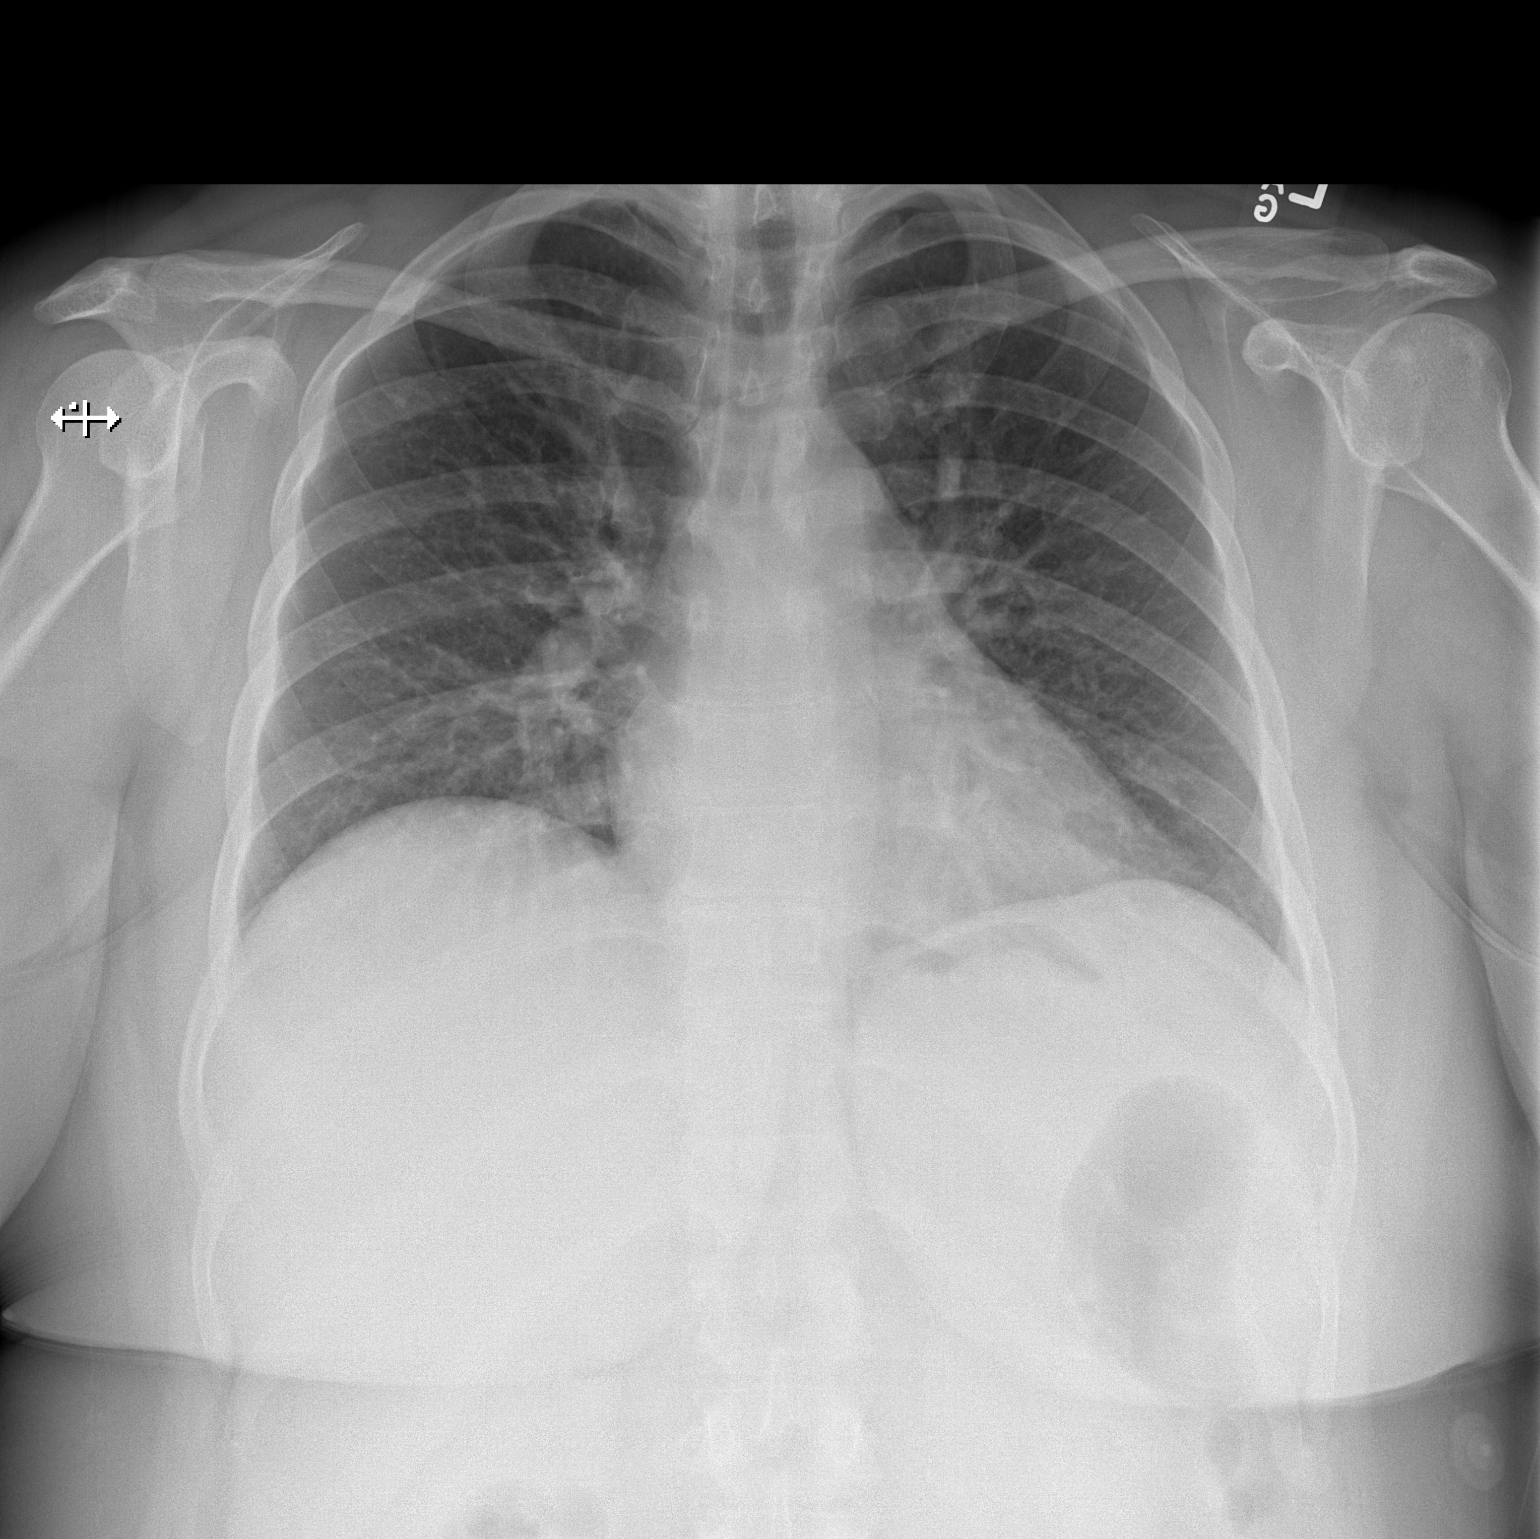

[w chest lat]
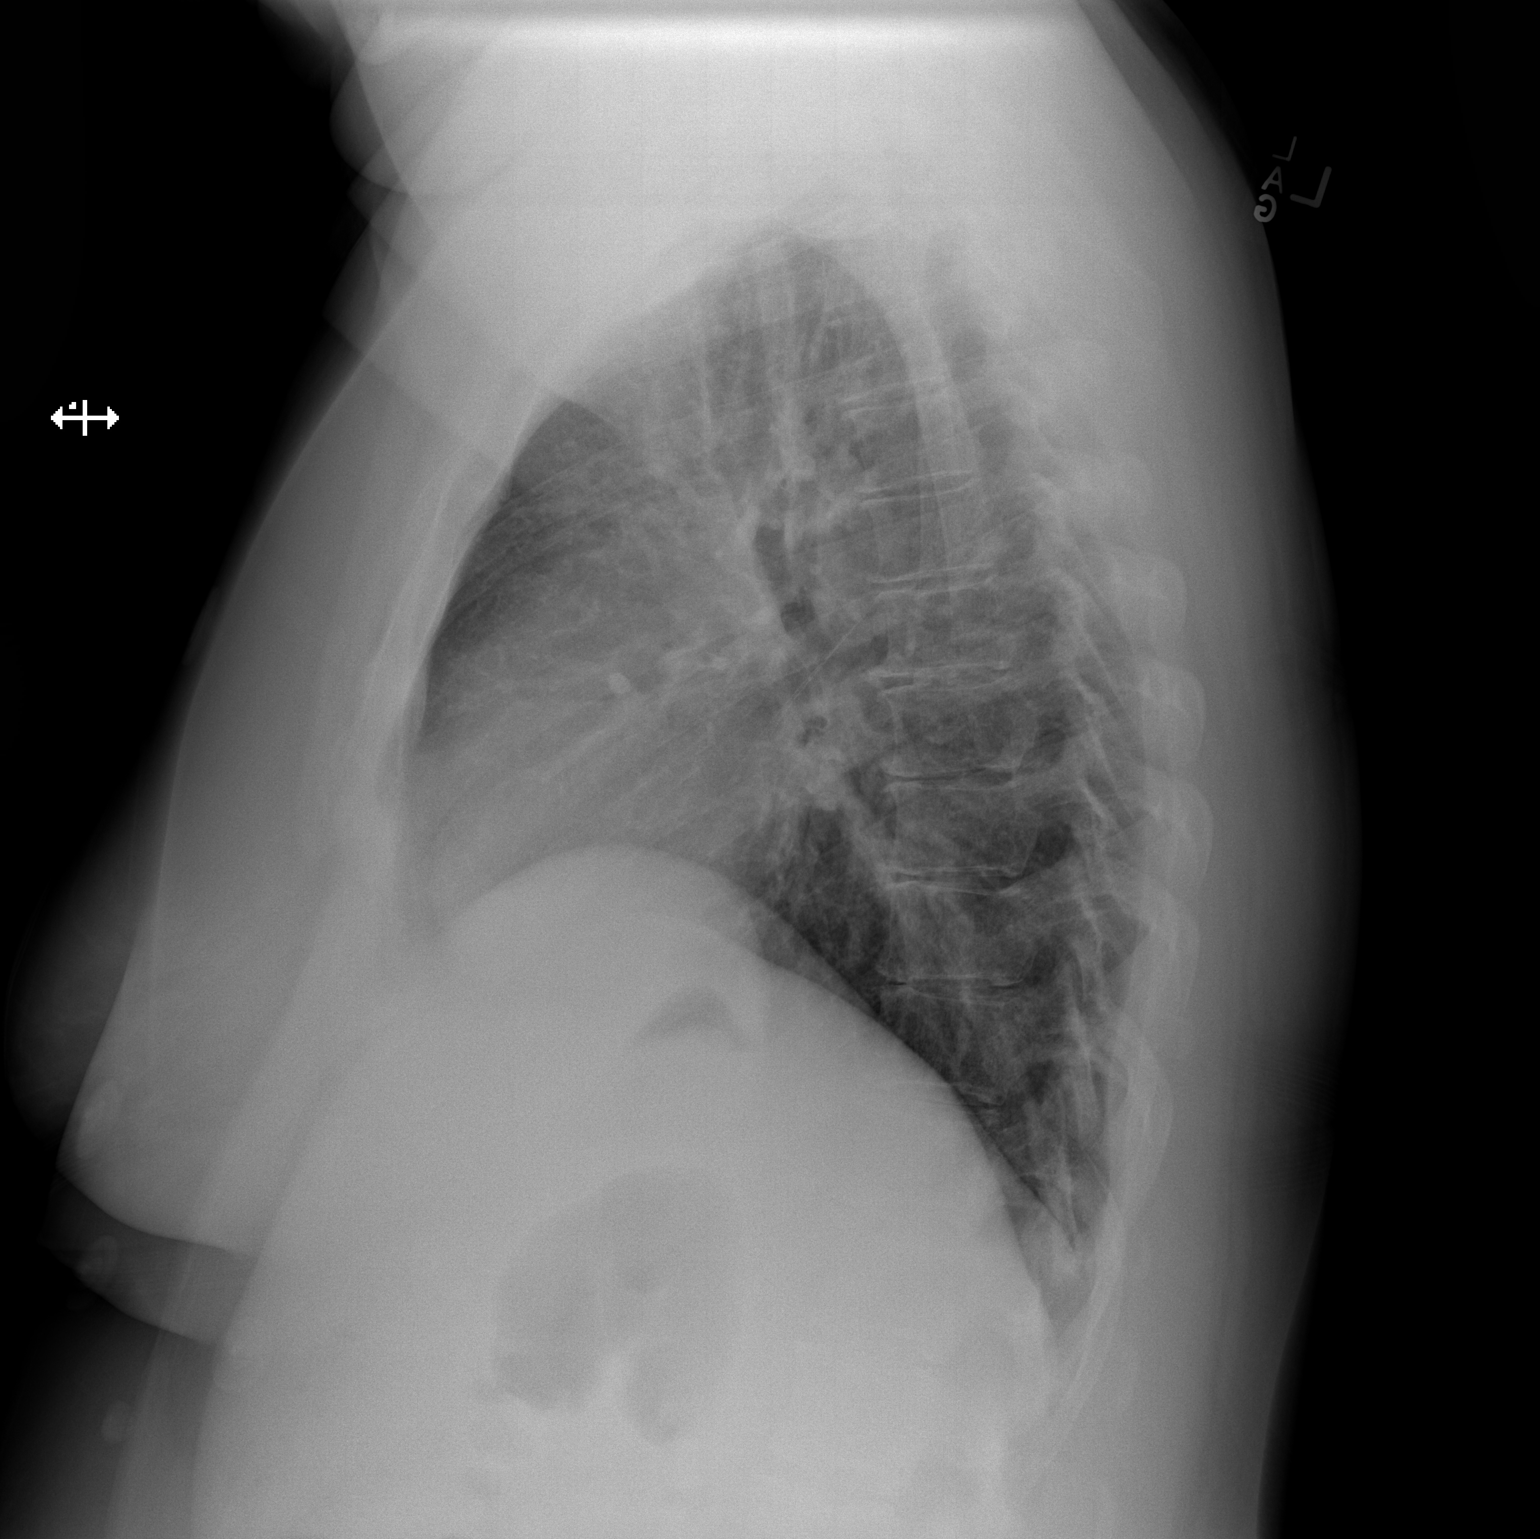

[2 of 2 positions shown; findings below may reference images not displayed]

FINDINGS: The heart size and mediastinal contours are within normal limits.
Both lungs are clear. The visualized skeletal structures are
unremarkable.
IMPRESSION: No active cardiopulmonary disease.

## 2019-05-09 ENCOUNTER — Other Ambulatory Visit: Payer: Self-pay | Admitting: Cardiology

## 2019-05-09 DIAGNOSIS — Z20822 Contact with and (suspected) exposure to covid-19: Secondary | ICD-10-CM

## 2019-05-10 LAB — NOVEL CORONAVIRUS, NAA: SARS-CoV-2, NAA: NOT DETECTED

## 2023-06-24 ENCOUNTER — Other Ambulatory Visit: Payer: Self-pay

## 2023-06-24 ENCOUNTER — Emergency Department (HOSPITAL_COMMUNITY)
Admission: EM | Admit: 2023-06-24 | Discharge: 2023-06-24 | Disposition: A | Discharge status: Home / Self Care | Payer: Self-pay | Attending: Emergency Medicine | Admitting: Emergency Medicine

## 2023-06-24 ENCOUNTER — Encounter (HOSPITAL_COMMUNITY): Payer: Self-pay

## 2023-06-24 DIAGNOSIS — K029 Dental caries, unspecified: Secondary | ICD-10-CM | POA: Insufficient documentation

## 2023-06-24 DIAGNOSIS — Z91040 Latex allergy status: Secondary | ICD-10-CM | POA: Insufficient documentation

## 2023-06-24 MED ORDER — IBUPROFEN 600 MG PO TABS: 600.0000 mg | ORAL_TABLET | Freq: Four times a day (QID) | ORAL | 0 refills | Status: AC | PRN

## 2023-06-24 MED ORDER — CLINDAMYCIN HCL 300 MG PO CAPS: 300.0000 mg | ORAL_CAPSULE | Freq: Four times a day (QID) | ORAL | 0 refills | Status: AC

## 2023-06-24 NOTE — Discharge Instructions (Signed)
Take antibiotic as prescribed and follow-up closely with dentist for further care.

## 2023-06-24 NOTE — ED Provider Notes (Signed)
[place] EMERGENCY DEPARTMENT AT [facility] Provider Note   CSN: [id number] Arrival date & time: 06/24/23  1121     History  Chief Complaint  Patient presents with   Dental Pain    Meghan Schroeder is a 42 y.o. female.  The history is provided by the patient and medical records. No language interpreter was used.  Dental Pain Associated symptoms: no fever      42 year old female significant history of recurrent dental abscess presenting today with concerns of dental infection.  Patient report pain to her right upper jaw that started yesterday and worsened today.  Pain worse with chewing and felt similar to prior dental infection that she has had in the past.  She denies any fever or chills no trouble swallowing no headache no neck pain.  She denies any trauma.  She requesting for antibiotic.  She does not have a dentist.  Home Medications Prior to Admission medications   Medication Sig Start Date End Date Taking? Authorizing Provider  azithromycin (ZITHROMAX Z-PAK) 250 MG tablet Take 2 pills today, then 1 pill daily until gone. Patient not taking: Reported on 12/22/2015 11/30/15   Charm Rings, MD  chlorpheniramine-HYDROcodone [facility]) 10-8 MG/5ML SUER Take 5 mLs by mouth every 12 (twelve) hours as needed for cough. Patient not taking: Reported on 12/22/2015 11/30/15   Charm Rings, MD  clindamycin (CLEOCIN) 300 MG capsule Take 1 capsule (300 mg total) by mouth 4 (four) times daily. X 7 days 12/23/15   Lurene Shadow, PA-C  HYDROcodone-acetaminophen (NORCO/VICODIN) 5-325 MG tablet Take 1-2 tablets by mouth every 6 (six) hours as needed. 12/23/15   Lurene Shadow, PA-C  medroxyPROGESTERone (DEPO-PROVERA) 150 MG/ML injection Inject 1 mL into the muscle every 3 (three) months.  10/13/15 10/12/16  [provider]  naproxen (NAPROSYN) 500 MG tablet Take 1 tablet (500 mg total) by mouth 2 (two) times daily. 12/23/15   Lurene Shadow, PA-C  predniSONE  (DELTASONE) 50 MG tablet Take 1 pill daily for 5 days. Patient not taking: Reported on 12/22/2015 11/30/15   Charm Rings, MD  pantoprazole (PROTONIX) 20 MG tablet Take 1 tablet (20 mg total) by mouth daily. 09/10/15 11/30/15  Antony Madura, PA-C      Allergies    Penicillins, Benadryl [diphenhydramine hcl], and Latex    Review of Systems   Review of Systems  Constitutional:  Negative for fever.  HENT:  Positive for dental problem.     Physical Exam Updated Vital Signs BP (!) 166/87 (BP Location: Left Arm)   Pulse 83   Temp 98 F (36.7 C) (Oral)   Resp 18   Ht 5\' 5"  (1.651 m)   Wt 136.1 kg   LMP  (LMP Unknown)   SpO2 98%   BMI 49.92 kg/m  Physical Exam Vitals and nursing note reviewed.  Constitutional:      General: She is not in acute distress.    Appearance: She is well-developed.  HENT:     Head: Atraumatic.     Mouth/Throat:     Comments: Widespread dental decay.  Tenderness noted to right upper gumline with mild erythema with no obvious abscess and no facial involvement. Eyes:     Conjunctiva/sclera: Conjunctivae normal.  Pulmonary:     Effort: Pulmonary effort is normal.  Musculoskeletal:     Cervical back: Neck supple.  Skin:    Findings: No rash.  Neurological:     Mental Status: She is alert.  Psychiatric:        Mood and Affect: Mood normal.     ED Results / Procedures / Treatments   Labs (all labs ordered are listed, but only abnormal results are displayed) Labs Reviewed - No data to display  EKG None  Radiology No results found.  Procedures Procedures    Medications Ordered in ED Medications - No data to display  ED Course/ Medical Decision Making/ A&P                                 Medical Decision Making  BP (!) 166/87 (BP Location: Left Arm)   Pulse 83   Temp 98 F (36.7 C) (Oral)   Resp 18   Ht 5\' 5"  (1.651 m)   Wt 136.1 kg   LMP  (LMP Unknown)   SpO2 98%   BMI 49.92 kg/m   34:33 PM 42 year old female significant  history of recurrent dental abscess presenting today with concerns of dental infection.  Patient report pain to her right upper jaw that started yesterday and worsened today.  Pain worse with chewing and felt similar to prior dental infection that she has had in the past.  She denies any fever or chills no trouble swallowing no headache no neck pain.  She denies any trauma.  She requesting for antibiotic.  She does not have a dentist.  Exam remarkable for widespread dental decay and tenderness to right upper jaw concerning for dental infection.  No obvious abscess of amenable for drainage.  Presentation consistent with dental pain of tooth # 3. No evidence of Ludwig's Angina, large abscess pocket, requirement for emergent extraction, or other complications. Provided prescription for clindamycin. Provided prescription for ibuprofen. Patient informed to follow up with  local provider. Return to ER if pain uncontrolled, abscess that drains purulent fluid, high fevers, trouble swallowing, or other concerns.  Impression: Dental Pain  Informed to return to ED if has new or worsening symptoms. Expressed understanding of and agreement with plan and all questions answered.         Final Clinical Impression(s) / ED Diagnoses Final diagnoses:  Pain due to dental caries    Rx / DC Orders ED Discharge Orders          Ordered    ibuprofen (ADVIL) 600 MG tablet  Every 6 hours PRN        06/24/23 1208    clindamycin (CLEOCIN) 300 MG capsule  4 times daily        06/24/23 1208              Fayrene Helper, PA-C 06/24/23 1208    Elayne Snare K, DO 06/24/23 1354

## 2023-06-24 NOTE — ED Triage Notes (Signed)
Pt has hx of dental abscess. Pt woke up today and noticed swelling on the upper right side of her jaw. Concerned it is another abscess and hoping to receive an abx for it.
